# Patient Record
Sex: Female | Born: 2005 | Race: White | Hispanic: No | Marital: Single | Smoking: Never smoker
Health system: Southern US, Community
[De-identification: ages and names within clinical notes are randomized; demographics above are authoritative.]

## PROBLEM LIST (undated history)

## (undated) DIAGNOSIS — J45909 Unspecified asthma, uncomplicated: Secondary | ICD-10-CM

---
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
## 2023-06-29 ENCOUNTER — Other Ambulatory Visit: Payer: Self-pay

## 2023-06-29 ENCOUNTER — Emergency Department

## 2023-06-29 ENCOUNTER — Emergency Department
Admission: EM | Admit: 2023-06-29 | Discharge: 2023-06-29 | Disposition: A | Discharge status: Home / Self Care | Attending: Emergency Medicine | Admitting: Emergency Medicine

## 2023-06-29 ENCOUNTER — Encounter: Payer: Self-pay | Admitting: Emergency Medicine

## 2023-06-29 DIAGNOSIS — R0789 Other chest pain: Secondary | ICD-10-CM | POA: Insufficient documentation

## 2023-06-29 DIAGNOSIS — J45909 Unspecified asthma, uncomplicated: Secondary | ICD-10-CM | POA: Diagnosis not present

## 2023-06-29 DIAGNOSIS — R1031 Right lower quadrant pain: Secondary | ICD-10-CM | POA: Insufficient documentation

## 2023-06-29 DIAGNOSIS — R109 Unspecified abdominal pain: Secondary | ICD-10-CM

## 2023-06-29 DIAGNOSIS — R079 Chest pain, unspecified: Secondary | ICD-10-CM | POA: Diagnosis present

## 2023-06-29 HISTORY — DX: Unspecified asthma, uncomplicated: J45.909

## 2023-06-29 LAB — HEPATIC FUNCTION PANEL
ALT: 11 U/L (ref 0–44)
AST: 16 U/L (ref 15–41)
Albumin: 4 g/dL (ref 3.5–5.0)
Alkaline Phosphatase: 58 U/L (ref 38–126)
Bilirubin, Direct: <0.1 mg/dL (ref 0.0–0.2)
Total Bilirubin: 0.4 mg/dL (ref 0.0–1.2)
Total Protein: 7.2 g/dL (ref 6.5–8.1)

## 2023-06-29 LAB — CBC
HCT: 43.7 % (ref 36.0–46.0)
Hemoglobin: 14.8 g/dL (ref 12.0–15.0)
MCH: 30.8 pg (ref 26.0–34.0)
MCHC: 33.9 g/dL (ref 30.0–36.0)
MCV: 91 fL (ref 80.0–100.0)
Platelets: 362 10*3/uL (ref 150–400)
RBC: 4.8 MIL/uL (ref 3.87–5.11)
RDW: 12.9 % (ref 11.5–15.5)
WBC: 12.8 10*3/uL — ABNORMAL HIGH (ref 4.0–10.5)
nRBC: 0 % (ref 0.0–0.2)

## 2023-06-29 LAB — TROPONIN I (HIGH SENSITIVITY)
Troponin I (High Sensitivity): <2 ng/L (ref <18)
Troponin I (High Sensitivity): <2 ng/L (ref <18)

## 2023-06-29 LAB — BASIC METABOLIC PANEL
Anion gap: 8 (ref 5–15)
BUN: 10 mg/dL (ref 6–20)
CO2: 23 mmol/L (ref 22–32)
Calcium: 9.6 mg/dL (ref 8.9–10.3)
Chloride: 105 mmol/L (ref 98–111)
Creatinine, Ser: 0.6 mg/dL (ref 0.44–1.00)
GFR, Estimated: >60 mL/min (ref >60)
Glucose, Bld: 86 mg/dL (ref 70–99)
Potassium: 3.7 mmol/L (ref 3.5–5.1)
Sodium: 136 mmol/L (ref 135–145)

## 2023-06-29 LAB — RESP PANEL BY RT-PCR (RSV, FLU A&B, COVID)  RVPGX2
Influenza A by PCR: NEGATIVE
Influenza B by PCR: NEGATIVE
Resp Syncytial Virus by PCR: NEGATIVE
SARS Coronavirus 2 by RT PCR: NEGATIVE

## 2023-06-29 LAB — LIPASE, BLOOD: Lipase: 30 U/L (ref 11–51)

## 2023-06-29 LAB — HCG, QUANTITATIVE, PREGNANCY: hCG, Beta Chain, Quant, S: <1 m[IU]/mL (ref <5)

## 2023-06-29 LAB — POC URINE PREG, ED: Preg Test, Ur: NEGATIVE

## 2023-06-29 MED ORDER — ACETAMINOPHEN 500 MG PO TABS
1000.0000 mg | ORAL_TABLET | Freq: Once | ORAL | Status: AC
  Administered 2023-06-29: 1000 mg via ORAL
  Filled 2023-06-29: qty 2

## 2023-06-29 MED ORDER — IOHEXOL 300 MG/ML  SOLN
75.0000 mL | Freq: Once | INTRAMUSCULAR | Status: AC | PRN
  Administered 2023-06-29: 75 mL via INTRAVENOUS

## 2023-06-29 NOTE — ED Provider Notes (Signed)
 [facility] Provider Note    Event Date/Time   First MD Initiated Contact with Patient 06/29/23 1643     (approximate)   History   Chest Pain   HPI  Tammy Baird is a 18 y.o. female with history of asthma presenting with 1 week of chest pain, cough, right upper and right lower quadrant abdominal pain.  She denies any nausea, vomiting, diarrhea.  States her appendix and gallbladder are still in.  No fever.  States that she feels a little shortness of breath when she is working and moving around but no shortness of breath right now.  Has not taken anything for the pain.  No urinary symptoms.  Denies taking any hormones or OCPs, no recent travel or surgeries, no unilateral calf swelling or tenderness, no prior cardiac history, no history of malignancy or DVTs.   On independent chart review, she was seen by her cardiologist in September last year, was seen at that time for dizziness and syncope, also with dyspnea on exertion.  Had an echo done that showed normal EF, carotid ultrasound without any significant plaque or stenosis, had a Holter that was placed as well and during that visit the results are pending.  Physical Exam   Triage Vital Signs: ED Triage Vitals  Encounter Vitals Group     BP 06/29/23 1557 125/65     Systolic BP Percentile --      Diastolic BP Percentile --      Pulse Rate 06/29/23 1557 85     Resp 06/29/23 1557 18     Temp 06/29/23 1557 97.7 F (36.5 C)     Temp Source 06/29/23 1557 Oral     SpO2 06/29/23 1557 100 %     Weight 06/29/23 1556 150 lb (68 kg)     Height 06/29/23 1556 4\' 8"  (1.422 m)     Head Circumference --      Peak Flow --      Pain Score 06/29/23 1556 7     Pain Loc --      Pain Education --      Exclude from Growth Chart --     Most recent vital signs: Vitals:   06/29/23 1557 06/29/23 1920  BP: 125/65 120/74  Pulse: 85 88  Resp: 18 18  Temp: 97.7 F (36.5 C)   SpO2: 100% 99%     General: Awake, no  distress.  CV:  Good peripheral perfusion.  Resp:  Normal effort.  Clear, no tachypnea or increased work of breathing Abd:  No distention.  Soft, tender in the right lower quadrant, no right upper quadrant abdominal pain, no guarding. Other:  Right anterior thoracic cage is tender to palpation.  No bilateral lower extremity edema, no unilateral calf swelling or tenderness.   ED Results / Procedures / Treatments   Labs (all labs ordered are listed, but only abnormal results are displayed) Labs Reviewed  CBC - Abnormal; Notable for the following components:      Result Value   WBC 12.8 (*)    All other components within normal limits  RESP PANEL BY RT-PCR (RSV, FLU A&B, COVID)  RVPGX2  BASIC METABOLIC PANEL  HEPATIC FUNCTION PANEL  LIPASE, BLOOD  HCG, QUANTITATIVE, PREGNANCY  POC URINE PREG, ED  TROPONIN I (HIGH SENSITIVITY)  TROPONIN I (HIGH SENSITIVITY)     EKG  EKG showed sinus rhythm with sinus arrhythmia, rate 91, normal QRS, normal QTc, T wave flattening in V3,  aVL, no ischemic ST elevation, T wave flattening is new compared to prior   RADIOLOGY CT on my independent or potation without obvious free air   PROCEDURES:  Critical Care performed: No  Procedures   MEDICATIONS ORDERED IN ED: Medications  acetaminophen (TYLENOL) tablet 1,000 mg (1,000 mg Oral Given 06/29/23 1754)  iohexol (OMNIPAQUE) 300 MG/ML solution 75 mL (75 mLs Intravenous Contrast Given 06/29/23 1906)     IMPRESSION / MDM / ASSESSMENT AND PLAN / ED COURSE  I reviewed the triage vital signs and the nursing notes.                              Differential diagnosis includes, but is not limited to, viral illness, pneumonia, electrolyte derangements, ACS, costochondritis, musculoskeletal pain.  Considered PE but patient is PERC 0.  For her right lower quad abdominal pain, considered appendicitis, colitis, IBS, IBD, celiac, early gastroenteritis.  Will get a CT of the abdomen pelvis, will get labs,  EKG, troponin, chest x-ray.  Give her Tylenol.  Patient's presentation is most consistent with acute presentation with potential threat to life or bodily function.  Independent review of labs and imaging are below.  On reassessment patient is eating [employer] in the room.  States that her symptoms have resolved.  Shared decision making done with patient and she is agreeable plan for discharge, will give her referral to primary care here since she does not have a primary care doctor.  Considered but no indication for inpatient admission at this time, she is safe for outpatient management.  Will discharge strict precautions.  Social determinants of health, lack of primary care.  Clinical Course as of 06/29/23 1957  Tue Jun 29, 2023  1647 DG Chest 2 View No active cardiopulmonary disease.  [TT]  1731 Independent review of labs, mild leukocytosis, troponin is negative, hCG is negative, electrolytes not severely deranged, LFTs are normal. [TT]  1922 CT ABDOMEN PELVIS W CONTRAST IMPRESSION: No acute process demonstrated in the abdomen or pelvis. Small amount of free fluid in the pelvis is likely physiologic. No evidence of bowel obstruction or inflammation. Appendix is normal.   [TT]    Clinical Course User Index [TT] Jodie Echevaria Franchot Erichsen, MD     FINAL CLINICAL IMPRESSION(S) / ED DIAGNOSES   Final diagnoses:  Chest wall pain  Abdominal pain, unspecified abdominal location     Rx / DC Orders   ED Discharge Orders          Ordered    Ambulatory Referral to Primary Care (Establish Care)        06/29/23 1955             Note:  This document was prepared using Dragon voice recognition software and may include unintentional dictation errors.    Claybon Jabs, MD 06/29/23 [phone]

## 2023-06-29 NOTE — ED Triage Notes (Signed)
 Patient to ED via POV for right sided CP. States ongoing over a year. States also having abd pain. Denies N/V/D.
